# Patient Record
Sex: Male | Born: 1985
Health system: Southern US, Community
[De-identification: ages and names within clinical notes are randomized; demographics above are authoritative.]

## PROBLEM LIST (undated history)

## (undated) DIAGNOSIS — T7840XA Allergy, unspecified, initial encounter: Secondary | ICD-10-CM

## (undated) DIAGNOSIS — F419 Anxiety disorder, unspecified: Secondary | ICD-10-CM

## (undated) HISTORY — DX: Anxiety disorder, unspecified: F41.9

## (undated) HISTORY — PX: MOUTH SURGERY: SHX715

## (undated) HISTORY — DX: Allergy, unspecified, initial encounter: T78.40XA

---

## 2013-06-02 DIAGNOSIS — J309 Allergic rhinitis, unspecified: Secondary | ICD-10-CM | POA: Insufficient documentation

## 2013-06-02 DIAGNOSIS — R0982 Postnasal drip: Secondary | ICD-10-CM

## 2014-09-21 ENCOUNTER — Encounter (INDEPENDENT_AMBULATORY_CARE_PROVIDER_SITE_OTHER): Payer: Self-pay

## 2014-09-21 ENCOUNTER — Other Ambulatory Visit: Payer: Self-pay | Admitting: Family Medicine

## 2014-09-21 ENCOUNTER — Encounter: Payer: Self-pay | Admitting: Family Medicine

## 2014-09-21 ENCOUNTER — Ambulatory Visit (INDEPENDENT_AMBULATORY_CARE_PROVIDER_SITE_OTHER): Payer: BLUE CROSS/BLUE SHIELD | Admitting: Family Medicine

## 2014-09-21 VITALS — BP 122/82 | HR 71 | Temp 97.6°F | Resp 16 | Ht 71.0 in | Wt 179.5 lb

## 2014-09-21 DIAGNOSIS — Z131 Encounter for screening for diabetes mellitus: Secondary | ICD-10-CM | POA: Diagnosis not present

## 2014-09-21 DIAGNOSIS — Z13220 Encounter for screening for lipoid disorders: Secondary | ICD-10-CM | POA: Diagnosis not present

## 2014-09-21 DIAGNOSIS — Z23 Encounter for immunization: Secondary | ICD-10-CM

## 2014-09-21 DIAGNOSIS — H6120 Impacted cerumen, unspecified ear: Secondary | ICD-10-CM | POA: Insufficient documentation

## 2014-09-21 DIAGNOSIS — J45909 Unspecified asthma, uncomplicated: Secondary | ICD-10-CM

## 2014-09-21 DIAGNOSIS — Z21 Asymptomatic human immunodeficiency virus [HIV] infection status: Secondary | ICD-10-CM | POA: Insufficient documentation

## 2014-09-21 DIAGNOSIS — Z0000 Encounter for general adult medical examination without abnormal findings: Secondary | ICD-10-CM | POA: Diagnosis not present

## 2014-09-21 DIAGNOSIS — R0982 Postnasal drip: Secondary | ICD-10-CM

## 2014-09-21 DIAGNOSIS — J309 Allergic rhinitis, unspecified: Secondary | ICD-10-CM

## 2014-09-21 DIAGNOSIS — Z1389 Encounter for screening for other disorder: Secondary | ICD-10-CM | POA: Insufficient documentation

## 2014-09-21 DIAGNOSIS — F988 Other specified behavioral and emotional disorders with onset usually occurring in childhood and adolescence: Secondary | ICD-10-CM | POA: Insufficient documentation

## 2014-09-21 DIAGNOSIS — F4322 Adjustment disorder with anxiety: Secondary | ICD-10-CM | POA: Insufficient documentation

## 2014-09-21 DIAGNOSIS — J069 Acute upper respiratory infection, unspecified: Secondary | ICD-10-CM | POA: Insufficient documentation

## 2014-09-21 MED ORDER — OLOPATADINE HCL 0.6 % NA SOLN: NASAL | Status: DC

## 2014-09-21 NOTE — Progress Notes (Signed)
Name: Eric Patel   MRN: [id number]    DOB: 1985-07-13   Date:09/21/2014       Progress Note  Subjective  Chief Complaint  Chief Complaint  Patient presents with  . Annual Exam    patient states eveything is going well.    HPI  Patient is here today for a Complete Male Physical Exam:  The patient has no acute concerns. Overall feels healthy. Diet is well balanced. In general does exercise regularly. Sees dentist regularly and addresses vision concerns with ophthalmologist if applicable. In regards to sexual activity the patient is currently sexually active. Currently is not concerned about exposure to any STDs. Has previously tested positive for HIV Antibody but subsequent work up and consultation with ID specialist resulted in no significant findings. 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 Still taking Lexapro daily although he did try taking a sabbatical from the medication and noticed a difference and went right back on the medication. Otherwise using ADD medication sparingly.   Allergic Rhinitis: Eric Patel is here for evaluation of possible allergic rhinitis. Patient's symptoms include postnasal drip, sinus pressure and pressure sensation base of tongue back of throat.. These symptoms are unpredictable. Current triggers include exposure to no known precipitant. The patient has been suffering from these symptoms for approximately several  months. The patient has tried over the counter medications, prescription antihistamines and prescription nasal sprays with unsatisfactory relief of symptoms. Immunotherapy has never been tried. The patient has never had nasal polyps. The patient has no history of asthma. The patient does not suffer from frequent sinopulmonary infections. The patient has not had sinus surgery in the past. The patient has no history of eczema.    No problem-specific assessment & plan notes found  for this encounter.   Past Medical History  Diagnosis Date  . Allergy   . Anxiety   . HIV infection     active infection r/o by Dr. Sampson Goon. results were false positive.    Past Surgical History  Procedure Laterality Date  . Mouth surgery      at age 29    Family History  Problem Relation Age of Onset  . Hypertension Mother   . Hyperlipidemia Mother   . Diabetes Mother     prediabetic  . Keratoconus Mother   . Thyroid disease Mother     History   Social History  . Marital Status: Single    Spouse Name: N/A  . Number of Children: 0  . Years of Education: N/A   Occupational History  . Assistant Professor     [employer]   Social History Main Topics  . Smoking status: Never Smoker   . Smokeless tobacco: Not on file  . Alcohol Use: 0.0 oz/week    0 Standard drinks or equivalent per week     Comment: socially  . Drug Use: Yes  . Sexual Activity:    Partners: Female    Pharmacist, hospital Protection: Implant   Other Topics Concern  . Not on file   Social History Narrative  . No narrative on file     Current outpatient prescriptions:  .  amphetamine-dextroamphetamine (ADDERALL) 5 MG tablet, Take by mouth., Disp: , Rfl:  .  clonazePAM (KLONOPIN) 0.5 MG tablet, Take by mouth., Disp: , Rfl:  .  escitalopram (LEXAPRO) 10 MG tablet, Take by mouth., Disp: , Rfl:  .  Olopatadine HCl 0.6 % SOLN, 2 sprays in each  nostril twice a day, Disp: 1 Bottle, Rfl: 1  Allergies  Allergen Reactions  . Ibuprofen     face swelling.    ROS  CONSTITUTIONAL: No significant weight changes, fever, chills, weakness or fatigue.  HEENT:  - Eyes: No visual changes.  - Ears: No auditory changes. No pain.  - Nose: No sneezing, congestion, runny nose. - Throat: No sore throat. No changes in swallowing. No choking sensation. SKIN: No rash or itching.  CARDIOVASCULAR: No chest pain, chest pressure or chest discomfort. No palpitations or edema.  RESPIRATORY: No shortness of breath, cough or  sputum.  GASTROINTESTINAL: No anorexia, nausea, vomiting. No changes in bowel habits. No abdominal pain or blood.  GENITOURINARY: No dysuria. No frequency. No discharge.  NEUROLOGICAL: No headache, dizziness, syncope, paralysis, ataxia, numbness or tingling in the extremities. No memory changes. No change in bowel or bladder control.  MUSCULOSKELETAL: No joint pain. No muscle pain. HEMATOLOGIC: No anemia, bleeding or bruising.  LYMPHATICS: No enlarged lymph nodes.  PSYCHIATRIC: No change in mood. No change in sleep pattern.  ENDOCRINOLOGIC: No reports of sweating, cold or heat intolerance. No polyuria or polydipsia.   Objective  Filed Vitals:   09/21/14 0901  BP: 122/82  Pulse: 71  Temp: 97.6 F (36.4 C)  TempSrc: Oral  Resp: 16  Height: 5\' 11"  (1.803 m)  Weight: 179 lb 8 oz (81.421 kg)  SpO2: 98%   Body mass index is 25.05 kg/(m^2).   Depression screen PHQ 2/9 09/21/2014  Decreased Interest 0  Down, Depressed, Hopeless 0  PHQ - 2 Score 0     Physical Exam  Constitutional: Patient appears well-developed and well-nourished. In no distress.  HEENT:  - Head: Normocephalic and atraumatic.  - Ears: Bilateral TMs gray, no erythema or effusion - Nose: Nasal mucosa moist, slightly boggy - Mouth/Throat: Oropharynx is clear and moist. No tonsillar hypertrophy or erythema. Some post nasal drainage.  - Eyes: Conjunctivae clear, EOM movements normal. PERRLA. No scleral icterus.  Neck: Normal range of motion. Neck supple. No JVD present. No thyromegaly present.  Cardiovascular: Normal rate, regular rhythm and normal heart sounds.  No murmur heard.  Pulmonary/Chest: Effort normal and breath sounds normal. No respiratory distress. Abdominal: Soft. Bowel sounds are normal, no distension. There is no tenderness. no masses BREAST: Bilateral breast exam normal with no masses, skin changes or nipple discharge MALE GENITALIA: Bilateral testes descended with no masses, no penile lesions, no  penile discharge. PROSTATE: deferred RECTAL: deferred Musculoskeletal: Normal range of motion bilateral UE and LE, no joint effusions. Peripheral vascular: Bilateral LE no edema. Neurological: CN II-XII grossly intact with no focal deficits. Alert and oriented to person, place, and time. Coordination, balance, strength, speech and gait are normal.  Skin: Skin is warm and dry. No rash noted. No erythema. Scattered blemish spots and cherry hemangiomas back and upper arms.  Psychiatric: Patient has a normal mood and affect. Behavior is normal in office today. Judgment and thought content normal in office today.   Assessment & Plan  1. Annual physical exam Healthy male. He requested full panel of blood work.   - CBC with Differential/Platelet - Comprehensive metabolic panel - Lipid panel - TSH - Vit D  25 hydroxy (rtn osteoporosis monitoring) - HgB A1c - T3, free - T4, free  2. Allergic rhinitis with postnasal drip Stop Flonase as this is ineffective. Has also tried claritin, zyrtec, allegra with inconsistent regulst. Will try Olopatadine.   - Olopatadine HCl 0.6 % SOLN; 2 sprays  in each nostril twice a day  Dispense: 1 Bottle; Refill: 1  3. Screening cholesterol level  - Lipid panel  4. Screening for diabetes mellitus  - HgB A1c  5. Need for hepatitis B vaccination 2nd shot of Hep B series given today  - Hepatitis B vaccine adult IM  6. Need for diphtheria-tetanus-pertussis (Tdap) vaccine  - Tdap vaccine greater than or equal to 7yo IM

## 2014-09-22 LAB — COMPREHENSIVE METABOLIC PANEL
ALBUMIN: 4.7 g/dL (ref 3.5–5.5)
ALT: 80 IU/L — ABNORMAL HIGH (ref 0–44)
AST: 56 IU/L — ABNORMAL HIGH (ref 0–40)
Albumin/Globulin Ratio: 2.1 (ref 1.1–2.5)
Alkaline Phosphatase: 51 IU/L (ref 39–117)
BILIRUBIN TOTAL: 0.7 mg/dL (ref 0.0–1.2)
BUN/Creatinine Ratio: 15 (ref 8–19)
BUN: 15 mg/dL (ref 6–20)
CO2: 23 mmol/L (ref 18–29)
Calcium: 9.2 mg/dL (ref 8.7–10.2)
Chloride: 101 mmol/L (ref 97–108)
Creatinine, Ser: 0.97 mg/dL (ref 0.76–1.27)
GFR calc Af Amer: 121 mL/min/{1.73_m2} (ref >59)
GFR, EST NON AFRICAN AMERICAN: 105 mL/min/{1.73_m2} (ref >59)
Globulin, Total: 2.2 g/dL (ref 1.5–4.5)
Glucose: 95 mg/dL (ref 65–99)
Potassium: 4.8 mmol/L (ref 3.5–5.2)
Sodium: 141 mmol/L (ref 134–144)
TOTAL PROTEIN: 6.9 g/dL (ref 6.0–8.5)

## 2014-09-22 LAB — CBC WITH DIFFERENTIAL/PLATELET
BASOS: 1 %
Basophils Absolute: 0.1 10*3/uL (ref 0.0–0.2)
EOS (ABSOLUTE): 0.4 10*3/uL (ref 0.0–0.4)
EOS: 7 %
HEMOGLOBIN: 17.3 g/dL (ref 12.6–17.7)
Hematocrit: 50.1 % (ref 37.5–51.0)
Immature Grans (Abs): 0.1 10*3/uL (ref 0.0–0.1)
Immature Granulocytes: 1 %
Lymphocytes Absolute: 1.3 10*3/uL (ref 0.7–3.1)
Lymphs: 25 %
MCH: 31.1 pg (ref 26.6–33.0)
MCHC: 34.5 g/dL (ref 31.5–35.7)
MCV: 90 fL (ref 79–97)
MONOS ABS: 0.5 10*3/uL (ref 0.1–0.9)
Monocytes: 11 %
NEUTROS ABS: 2.8 10*3/uL (ref 1.4–7.0)
Neutrophils: 55 %
Platelets: 219 10*3/uL (ref 150–379)
RBC: 5.57 x10E6/uL (ref 4.14–5.80)
RDW: 13.1 % (ref 12.3–15.4)
WBC: 5.1 10*3/uL (ref 3.4–10.8)

## 2014-09-22 LAB — HEMOGLOBIN A1C
ESTIMATED AVERAGE GLUCOSE: 103 mg/dL
Hgb A1c MFr Bld: 5.2 % (ref 4.8–5.6)

## 2014-09-22 LAB — T3, FREE: T3 FREE: 3.4 pg/mL (ref 2.0–4.4)

## 2014-09-22 LAB — LIPID PANEL
Chol/HDL Ratio: 3.7 ratio units (ref 0.0–5.0)
Cholesterol, Total: 185 mg/dL (ref 100–199)
HDL: 50 mg/dL (ref >39)
LDL Calculated: 104 mg/dL — ABNORMAL HIGH (ref 0–99)
Triglycerides: 155 mg/dL — ABNORMAL HIGH (ref 0–149)
VLDL Cholesterol Cal: 31 mg/dL (ref 5–40)

## 2014-09-22 LAB — VITAMIN D 25 HYDROXY (VIT D DEFICIENCY, FRACTURES): Vit D, 25-Hydroxy: 59 ng/mL (ref 30.0–100.0)

## 2014-09-22 LAB — TSH: TSH: 2.52 u[IU]/mL (ref 0.450–4.500)

## 2014-09-22 LAB — T4, FREE: FREE T4: 1.05 ng/dL (ref 0.82–1.77)

## 2014-12-28 ENCOUNTER — Ambulatory Visit
Admission: RE | Admit: 2014-12-28 | Discharge: 2014-12-28 | Disposition: A | Discharge status: Home / Self Care | Payer: BLUE CROSS/BLUE SHIELD | Source: Ambulatory Visit | Attending: Family Medicine | Admitting: Family Medicine

## 2014-12-28 ENCOUNTER — Encounter: Payer: Self-pay | Admitting: Family Medicine

## 2014-12-28 ENCOUNTER — Ambulatory Visit
Admission: RE | Admit: 2014-12-28 | Payer: BLUE CROSS/BLUE SHIELD | Source: Ambulatory Visit | Admitting: Family Medicine

## 2014-12-28 ENCOUNTER — Ambulatory Visit (INDEPENDENT_AMBULATORY_CARE_PROVIDER_SITE_OTHER): Payer: BLUE CROSS/BLUE SHIELD | Admitting: Family Medicine

## 2014-12-28 VITALS — BP 118/64 | HR 91 | Temp 97.6°F | Resp 16 | Ht 70.5 in | Wt 181.7 lb

## 2014-12-28 DIAGNOSIS — M2550 Pain in unspecified joint: Secondary | ICD-10-CM | POA: Diagnosis not present

## 2014-12-28 DIAGNOSIS — F4322 Adjustment disorder with anxiety: Secondary | ICD-10-CM | POA: Diagnosis not present

## 2014-12-28 DIAGNOSIS — S46912A Strain of unspecified muscle, fascia and tendon at shoulder and upper arm level, left arm, initial encounter: Secondary | ICD-10-CM | POA: Insufficient documentation

## 2014-12-28 DIAGNOSIS — F988 Other specified behavioral and emotional disorders with onset usually occurring in childhood and adolescence: Secondary | ICD-10-CM

## 2014-12-28 DIAGNOSIS — M791 Myalgia, unspecified site: Secondary | ICD-10-CM | POA: Insufficient documentation

## 2014-12-28 DIAGNOSIS — X58XXXA Exposure to other specified factors, initial encounter: Secondary | ICD-10-CM | POA: Insufficient documentation

## 2014-12-28 DIAGNOSIS — F900 Attention-deficit hyperactivity disorder, predominantly inattentive type: Secondary | ICD-10-CM

## 2014-12-28 MED ORDER — TRAMADOL HCL 50 MG PO TABS: 50.0000 mg | ORAL_TABLET | Freq: Three times a day (TID) | ORAL | Status: DC | PRN

## 2014-12-28 MED ORDER — CLONAZEPAM 0.5 MG PO TABS: 0.5000 mg | ORAL_TABLET | Freq: Two times a day (BID) | ORAL | Status: DC | PRN

## 2014-12-28 MED ORDER — AMPHETAMINE-DEXTROAMPHETAMINE 5 MG PO TABS: 5.0000 mg | ORAL_TABLET | Freq: Two times a day (BID) | ORAL | Status: DC

## 2014-12-28 NOTE — Progress Notes (Signed)
Name: Eric Patel   MRN: [id number]    DOB: 1985-08-23   Date:12/28/2014       Progress Note  Subjective  Chief Complaint  Chief Complaint  Patient presents with  . Shoulder Pain    patient was working out at [employer] and afterwards had a stabbing pain. limited rom. has tried naproxen but has had no relief. patient has not tried any ice or heat.  . Extremity Weakness    patient has noticed some weakness in his legs. some muscle twitching around the knee area.  . Medication Refill    all meds    HPI  [name] is a 29 year old here today for refills of his routine medications and to discuss some new symptoms. Has been exercising with weights as usual and for the past 2 days notes left anterior shoulder pain. Felt like a stabbing pain initially and it is not very responsive to naproxen. He avoids Ibuprofen as he is allergic to it. He also notes restricted abduction. Not associated with numbness, tingling, weakness. Otherwise he also complains of generalized lower extremity joint and muscle pain, occasional muscle twitches like he had just worked up but can occur any time during work day. Does not wake him up at night. Joints involved usually bilateral knees, sometimes anterior hip areas. Does not smoke, no known history of circulatory disorder. Muscle pain not worse with ambulation.   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 He has stopped taking Lexapro. But is using clonazepam 0.5 mg sparingly, requesting refill today.  Otherwise using ADD medication sparingly. No side effects reported. The condition is characterized as fidgeting, loses items necessary for activity, interrupting others, poor attention span, shifting from one uncompleted task to another and easily distracted but not excessive talking, difficulty remaining seated, difficulty waiting for his turn, engages in physically dangerous activities, difficulty  waiting for his turn, blurting out answers before question is complete, difficulty following instructions or antisocial behavior. There has been no associated hearing difficulties, vision disturbances other than issues with diabetes, difficulty speaking or sleeping. He currently uses Adderall 5 mg po bid.    Past Medical History  Diagnosis Date  . Allergy   . Anxiety     Patient Active Problem List   Diagnosis Date Noted  . Adjustment disorder with anxiety 09/21/2014  . Adult attention deficit disorder 09/21/2014  . Screening for gout 09/21/2014  . Need for prophylactic vaccination and inoculation against viral hepatitis 09/21/2014  . Cerumen impaction 09/21/2014  . Infection of the upper respiratory tract 09/21/2014  . Allergic rhinitis with postnasal drip 06/02/2013    Social History  Substance Use Topics  . Smoking status: Never Smoker   . Smokeless tobacco: Not on file  . Alcohol Use: 0.0 oz/week    0 Standard drinks or equivalent per week     Comment: socially     Current outpatient prescriptions:  .  amphetamine-dextroamphetamine (ADDERALL) 5 MG tablet, Take by mouth., Disp: , Rfl:  .  clonazePAM (KLONOPIN) 0.5 MG tablet, Take by mouth., Disp: , Rfl:  .  escitalopram (LEXAPRO) 10 MG tablet, Take by mouth., Disp: , Rfl:  .  Olopatadine HCl 0.6 % SOLN, 2 sprays in each nostril twice a day, Disp: 1 Bottle, Rfl: 1  Past Surgical History  Procedure Laterality Date  . Mouth surgery      at age 41    Family History  Problem Relation  Age of Onset  . Hypertension Mother   . Hyperlipidemia Mother   . Diabetes Mother     prediabetic  . Keratoconus Mother   . Thyroid disease Mother     Allergies  Allergen Reactions  . Ibuprofen     face swelling.     Review of Systems  CONSTITUTIONAL: No significant weight changes, fever, chills, weakness or fatigue.  HEENT:  - Eyes: No visual changes.  - Ears: No auditory changes. No pain.  - Nose: No sneezing, congestion,  runny nose. - Throat: No sore throat. No changes in swallowing. SKIN: No rash or itching.  CARDIOVASCULAR: No chest pain, chest pressure or chest discomfort. No palpitations or edema.  RESPIRATORY: No shortness of breath, cough or sputum.  NEUROLOGICAL: No headache, dizziness, syncope, paralysis, ataxia, numbness or tingling in the extremities. No memory changes. No change in bowel or bladder control.  MUSCULOSKELETAL: Yes joint pain. Yes muscle pain. HEMATOLOGIC: No anemia, bleeding or bruising.  LYMPHATICS: No enlarged lymph nodes.  PSYCHIATRIC: No change in mood. No change in sleep pattern.  ENDOCRINOLOGIC: No reports of sweating, cold or heat intolerance. No polyuria or polydipsia.     Objective  BP 118/64 mmHg  Pulse 91  Temp(Src) 97.6 F (36.4 C) (Oral)  Resp 16  Ht 5' 10.5" (1.791 m)  Wt 181 lb 11.2 oz (82.419 kg)  BMI 25.69 kg/m2  SpO2 98% Body mass index is 25.69 kg/(m^2).  Physical Exam  Constitutional: Patient appears well-developed and well-nourished. In no distress.  Neck: Normal range of motion. Neck supple. No JVD present. No thyromegaly present.  Cardiovascular: Normal rate, regular rhythm and normal heart sounds.  No murmur heard.  Pulmonary/Chest: Effort normal and breath sounds normal. No respiratory distress. Musculoskeletal: Normal range of motion bilateral UE and LE, no joint effusions, although there is pain with abduction and flexion of left arm at shoulder beyond 100 degrees. Negative Hawkins, lift off and empty can testing. Peripheral vascular: Bilateral LE no edema. Neurological: CN II-XII grossly intact with no focal deficits. Alert and oriented to person, place, and time. Coordination, balance, strength, speech and gait are normal.  Skin: Skin is warm and dry. No rash noted. No erythema.  Psychiatric: Patient has a normal mood and affect. Behavior is normal in office today. Judgment and thought content normal in office today.   Assessment &  Plan  1. Adjustment disorder with anxiety Self discontinued Lexapro. Doing well.  - clonazePAM (KLONOPIN) 0.5 MG tablet; Take 1 tablet (0.5 mg total) by mouth 2 (two) times daily as needed for anxiety.  Dispense: 40 tablet; Refill: 5  2. Adult attention deficit disorder Refilled Adderall. Doing well with no side effects.   - amphetamine-dextroamphetamine (ADDERALL) 5 MG tablet; Take 1 tablet (5 mg total) by mouth 2 (two) times daily with a meal.  Dispense: 60 tablet; Refill: 0  3. Myalgia Etiology unclear in a healthy male with no significant physical findings. Will start with blood work.  - CBC with Differential/Platelet - Comprehensive metabolic panel - Magnesium - Ferritin - Iron - Iron and TIBC - ANA - Sedimentation rate - Rheumatoid factor - Aldolase  4. Multiple joint pain Rule out rheumatological disorder.  - traMADol (ULTRAM) 50 MG tablet; Take 1 tablet (50 mg total) by mouth every 8 (eight) hours as needed for moderate pain or severe pain.  Dispense: 40 tablet; Refill: 0 - CBC with Differential/Platelet - Comprehensive metabolic panel - Magnesium - Ferritin - Iron - Iron and TIBC - ANA -  Sedimentation rate - Rheumatoid factor  5. Left shoulder strain, initial encounter Soft tissue injury suggested based on clinical story and exam. Will get X-ray of left shoulder on file. Instructed on using heat, ice, home exercises and tramadol if needed. If no improvement I recommend PT.   - traMADol (ULTRAM) 50 MG tablet; Take 1 tablet (50 mg total) by mouth every 8 (eight) hours as needed for moderate pain or severe pain.  Dispense: 40 tablet; Refill: 0 - DG Shoulder Left; Future

## 2014-12-29 LAB — CBC WITH DIFFERENTIAL/PLATELET
Basophils Absolute: 0.1 10*3/uL (ref 0.0–0.2)
Basos: 1 %
EOS (ABSOLUTE): 0.3 10*3/uL (ref 0.0–0.4)
Eos: 7 %
HEMOGLOBIN: 17.2 g/dL (ref 12.6–17.7)
Hematocrit: 50.1 % (ref 37.5–51.0)
Immature Grans (Abs): 0 10*3/uL (ref 0.0–0.1)
Immature Granulocytes: 1 %
LYMPHS ABS: 1.2 10*3/uL (ref 0.7–3.1)
Lymphs: 28 %
MCH: 31.1 pg (ref 26.6–33.0)
MCHC: 34.3 g/dL (ref 31.5–35.7)
MCV: 91 fL (ref 79–97)
Monocytes Absolute: 0.4 10*3/uL (ref 0.1–0.9)
Monocytes: 9 %
NEUTROS ABS: 2.3 10*3/uL (ref 1.4–7.0)
Neutrophils: 54 %
PLATELETS: 194 10*3/uL (ref 150–379)
RBC: 5.53 x10E6/uL (ref 4.14–5.80)
RDW: 13.1 % (ref 12.3–15.4)
WBC: 4.3 10*3/uL (ref 3.4–10.8)

## 2014-12-29 LAB — RHEUMATOID FACTOR

## 2014-12-29 LAB — IRON AND TIBC
Iron Saturation: 57 % — ABNORMAL HIGH (ref 15–55)
Iron: 153 ug/dL (ref 38–169)
Total Iron Binding Capacity: 268 ug/dL (ref 250–450)
UIBC: 115 ug/dL (ref 111–343)

## 2014-12-29 LAB — COMPREHENSIVE METABOLIC PANEL
ALBUMIN: 4.9 g/dL (ref 3.5–5.5)
ALT: 27 IU/L (ref 0–44)
AST: 20 IU/L (ref 0–40)
Albumin/Globulin Ratio: 2.7 — ABNORMAL HIGH (ref 1.1–2.5)
Alkaline Phosphatase: 50 IU/L (ref 39–117)
BUN/Creatinine Ratio: 17 (ref 8–19)
BUN: 15 mg/dL (ref 6–20)
Bilirubin Total: 0.8 mg/dL (ref 0.0–1.2)
CHLORIDE: 101 mmol/L (ref 97–108)
CO2: 27 mmol/L (ref 18–29)
Calcium: 9.5 mg/dL (ref 8.7–10.2)
Creatinine, Ser: 0.87 mg/dL (ref 0.76–1.27)
GFR calc Af Amer: 134 mL/min/{1.73_m2} (ref >59)
GFR calc non Af Amer: 116 mL/min/{1.73_m2} (ref >59)
GLUCOSE: 91 mg/dL (ref 65–99)
Globulin, Total: 1.8 g/dL (ref 1.5–4.5)
Potassium: 4.8 mmol/L (ref 3.5–5.2)
Sodium: 143 mmol/L (ref 134–144)
Total Protein: 6.7 g/dL (ref 6.0–8.5)

## 2014-12-29 LAB — ANA: Anti Nuclear Antibody(ANA): NEGATIVE

## 2014-12-29 LAB — ALDOLASE: Aldolase: 4.7 U/L (ref 3.3–10.3)

## 2014-12-29 LAB — SEDIMENTATION RATE: SED RATE: 2 mm/h (ref 0–15)

## 2014-12-29 LAB — MAGNESIUM: MAGNESIUM: 2.2 mg/dL (ref 1.6–2.3)

## 2014-12-29 LAB — FERRITIN: Ferritin: 259 ng/mL (ref 30–400)

## 2015-10-03 ENCOUNTER — Encounter: Payer: BLUE CROSS/BLUE SHIELD | Admitting: Family Medicine

## 2015-10-05 ENCOUNTER — Encounter: Payer: Self-pay | Admitting: Family Medicine

## 2015-10-05 ENCOUNTER — Ambulatory Visit (INDEPENDENT_AMBULATORY_CARE_PROVIDER_SITE_OTHER): Payer: BLUE CROSS/BLUE SHIELD | Admitting: Family Medicine

## 2015-10-05 VITALS — BP 118/68 | HR 87 | Temp 99.3°F | Resp 18 | Ht 71.0 in | Wt 174.4 lb

## 2015-10-05 DIAGNOSIS — F900 Attention-deficit hyperactivity disorder, predominantly inattentive type: Secondary | ICD-10-CM

## 2015-10-05 DIAGNOSIS — F988 Other specified behavioral and emotional disorders with onset usually occurring in childhood and adolescence: Secondary | ICD-10-CM

## 2015-10-05 DIAGNOSIS — F410 Panic disorder [episodic paroxysmal anxiety] without agoraphobia: Secondary | ICD-10-CM | POA: Diagnosis not present

## 2015-10-05 DIAGNOSIS — Z23 Encounter for immunization: Secondary | ICD-10-CM

## 2015-10-05 DIAGNOSIS — Z79899 Other long term (current) drug therapy: Secondary | ICD-10-CM | POA: Diagnosis not present

## 2015-10-05 DIAGNOSIS — F430 Acute stress reaction: Secondary | ICD-10-CM

## 2015-10-05 DIAGNOSIS — H6123 Impacted cerumen, bilateral: Secondary | ICD-10-CM | POA: Diagnosis not present

## 2015-10-05 DIAGNOSIS — F4322 Adjustment disorder with anxiety: Secondary | ICD-10-CM

## 2015-10-05 DIAGNOSIS — Z0000 Encounter for general adult medical examination without abnormal findings: Secondary | ICD-10-CM | POA: Diagnosis not present

## 2015-10-05 LAB — CBC WITH DIFFERENTIAL/PLATELET
BASOS ABS: 42 {cells}/uL (ref 0–200)
BASOS PCT: 1 %
Eosinophils Absolute: 168 cells/uL (ref 15–500)
Eosinophils Relative: 4 %
HEMATOCRIT: 49 % (ref 38.5–50.0)
Hemoglobin: 17 g/dL (ref 13.2–17.1)
LYMPHS PCT: 30 %
Lymphs Abs: 1260 cells/uL (ref 850–3900)
MCH: 31.1 pg (ref 27.0–33.0)
MCHC: 34.7 g/dL (ref 32.0–36.0)
MCV: 89.6 fL (ref 80.0–100.0)
MONOS PCT: 10 %
MPV: 9.4 fL (ref 7.5–12.5)
Monocytes Absolute: 420 cells/uL (ref 200–950)
NEUTROS PCT: 55 %
Neutro Abs: 2310 cells/uL (ref 1500–7800)
PLATELETS: 186 10*3/uL (ref 140–400)
RBC: 5.47 MIL/uL (ref 4.20–5.80)
RDW: 12.9 % (ref 11.0–15.0)
WBC: 4.2 10*3/uL (ref 3.8–10.8)

## 2015-10-05 MED ORDER — AMPHETAMINE-DEXTROAMPHETAMINE 5 MG PO TABS: 5.0000 mg | ORAL_TABLET | Freq: Two times a day (BID) | ORAL | Status: AC

## 2015-10-05 MED ORDER — CLONAZEPAM 0.5 MG PO TABS: 0.5000 mg | ORAL_TABLET | Freq: Two times a day (BID) | ORAL | Status: AC | PRN

## 2015-10-05 NOTE — Assessment & Plan Note (Signed)
Explained benzo is controlled substance; went through typical speech; UDS collected; limited Rx for prn use; will see him every 3 months for this type of medicine; never ever use with alcohol

## 2015-10-05 NOTE — Assessment & Plan Note (Signed)
USPSTF grade A and B recommendations reviewed with patient; age-appropriate recommendations, preventive care, screening tests, etc discussed and encouraged; healthy living encouraged; see AVS for patient education given to patient  

## 2015-10-05 NOTE — Patient Instructions (Addendum)
OHIO -- only handle it once [facility] Distraction by [facility]  Health Maintenance, Male A healthy lifestyle and preventative care can promote health and wellness.  Maintain regular health, dental, and eye exams.  Eat a healthy diet. Foods like vegetables, fruits, whole grains, low-fat dairy products, and lean protein foods contain the nutrients you need and are low in calories. Decrease your intake of foods high in solid fats, added sugars, and salt. Get information about a proper diet from your health care provider, if necessary.  Regular physical exercise is one of the most important things you can do for your health. Most adults should get at least 150 minutes of moderate-intensity exercise (any activity that increases your heart rate and causes you to sweat) each week. In addition, most adults need muscle-strengthening exercises on 2 or more days a week.   Maintain a healthy weight. The body mass index (BMI) is a screening tool to identify possible weight problems. It provides an estimate of body fat based on height and weight. Your health care provider can find your BMI and can help you achieve or maintain a healthy weight. For males 20 years and older:  A BMI below 18.5 is considered underweight.  A BMI of 18.5 to 24.9 is normal.  A BMI of 25 to 29.9 is considered overweight.  A BMI of 30 and above is considered obese.  Maintain normal blood lipids and cholesterol by exercising and minimizing your intake of saturated fat. Eat a balanced diet with plenty of fruits and vegetables. Blood tests for lipids and cholesterol should begin at age 31 and be repeated every 5 years. If your lipid or cholesterol levels are high, you are over age 31, or you are at high risk for heart disease, you may need your cholesterol levels checked more frequently.Ongoing high lipid and cholesterol levels should be treated with medicines if diet and exercise are not working.  If you smoke, find out  from your health care provider how to quit. If you do not use tobacco, do not start.  Lung cancer screening is recommended for adults aged 55-80 years who are at high risk for developing lung cancer because of a history of smoking. A yearly low-dose CT scan of the lungs is recommended for people who have at least a 30-pack-year history of smoking and are current smokers or have quit within the past 15 years. A pack year of smoking is smoking an average of 1 pack of cigarettes a day for 1 year (for example, a 30-pack-year history of smoking could mean smoking 1 pack a day for 30 years or 2 packs a day for 15 years). Yearly screening should continue until the smoker has stopped smoking for at least 15 years. Yearly screening should be stopped for people who develop a health problem that would prevent them from having lung cancer treatment.  If you choose to drink alcohol, do not have more than 2 drinks per day. One drink is considered to be 12 oz (360 mL) of beer, 5 oz (150 mL) of wine, or 1.5 oz (45 mL) of liquor.  Avoid the use of street drugs. Do not share needles with anyone. Ask for help if you need support or instructions about stopping the use of drugs.  High blood pressure causes heart disease and increases the risk of stroke. High blood pressure is more likely to develop in:  People who have blood pressure in the end of the normal range (100-139/85-89 mm Hg).  People who are overweight or obese.  People who are African American.  If you are 41-18 years of age, have your blood pressure checked every 3-5 years. If you are 32 years of age or older, have your blood pressure checked every year. You should have your blood pressure measured twice--once when you are at a hospital or clinic, and once when you are not at a hospital or clinic. Record the average of the two measurements. To check your blood pressure when you are not at a hospital or clinic, you can use:  An automated blood pressure  machine at a pharmacy.  A home blood pressure monitor.  If you are 60-71 years old, ask your health care provider if you should take aspirin to prevent heart disease.  Diabetes screening involves taking a blood sample to check your fasting blood sugar level. This should be done once every 3 years after age 67 if you are at a normal weight and without risk factors for diabetes. Testing should be considered at a younger age or be carried out more frequently if you are overweight and have at least 1 risk factor for diabetes.  Colorectal cancer can be detected and often prevented. Most routine colorectal cancer screening begins at the age of 6 and continues through age 34. However, your health care provider may recommend screening at an earlier age if you have risk factors for colon cancer. On a yearly basis, your health care provider may provide home test kits to check for hidden blood in the stool. A small camera at the end of a tube may be used to directly examine the colon (sigmoidoscopy or colonoscopy) to detect the earliest forms of colorectal cancer. Talk to your health care provider about this at age 51 when routine screening begins. A direct exam of the colon should be repeated every 5-10 years through age 42, unless early forms of precancerous polyps or small growths are found.  People who are at an increased risk for hepatitis B should be screened for this virus. You are considered at high risk for hepatitis B if:  You were born in a country where hepatitis B occurs often. Talk with your health care provider about which countries are considered high risk.  Your parents were born in a high-risk country and you have not received a shot to protect against hepatitis B (hepatitis B vaccine).  You have HIV or AIDS.  You use needles to inject street drugs.  You live with, or have sex with, someone who has hepatitis B.  You are a man who has sex with other men (MSM).  You get hemodialysis  treatment.  You take certain medicines for conditions like cancer, organ transplantation, and autoimmune conditions.  Hepatitis C blood testing is recommended for all people born from [birth year] through 1966 and any individual with known risk factors for hepatitis C.  Healthy men should no longer receive prostate-specific antigen (PSA) blood tests as part of routine cancer screening. Talk to your health care provider about prostate cancer screening.  Testicular cancer screening is not recommended for adolescents or adult males who have no symptoms. Screening includes self-exam, a health care provider exam, and other screening tests. Consult with your health care provider about any symptoms you have or any concerns you have about testicular cancer.  Practice safe sex. Use condoms and avoid high-risk sexual practices to reduce the spread of sexually transmitted infections (STIs).  You should be screened for STIs, including gonorrhea and chlamydia if:  You are sexually active and are younger than 24 years.  You are older than 24 years, and your health care provider tells you that you are at risk for this type of infection.  Your sexual activity has changed since you were last screened, and you are at an increased risk for chlamydia or gonorrhea. Ask your health care provider if you are at risk.  If you are at risk of being infected with HIV, it is recommended that you take a prescription medicine daily to prevent HIV infection. This is called pre-exposure prophylaxis (PrEP). You are considered at risk if:  You are a man who has sex with other men (MSM).  You are a heterosexual man who is sexually active with multiple partners.  You take drugs by injection.  You are sexually active with a partner who has HIV.  Talk with your health care provider about whether you are at high risk of being infected with HIV. If you choose to begin PrEP, you should first be tested for HIV. You should then be tested  every 3 months for as long as you are taking PrEP.  Use sunscreen. Apply sunscreen liberally and repeatedly throughout the day. You should seek shade when your shadow is shorter than you. Protect yourself by wearing long sleeves, pants, a wide-brimmed hat, and sunglasses year round whenever you are outdoors.  Tell your health care provider of new moles or changes in moles, especially if there is a change in shape or color. Also, tell your health care provider if a mole is larger than the size of a pencil eraser.  A one-time screening for abdominal aortic aneurysm (AAA) and surgical repair of large AAAs by ultrasound is recommended for men aged 44-75 years who are current or former smokers.  Stay current with your vaccines (immunizations).   This information is not intended to replace advice given to you by your health care provider. Make sure you discuss any questions you have with your health care provider.   Document Released: 09/07/2007 Document Revised: 04/01/2014 Document Reviewed: 08/06/2010 Elsevier Interactive Patient Education [employer].

## 2015-10-05 NOTE — Progress Notes (Signed)
Patient ID: Eric Patel, male   DOB: 09/24/1985, 30 y.o.   MRN: [id number]   Subjective:   Eric SaltChristopher Patel is a 30 y.o. male here for a complete physical exam  Patient is new to me today; his previous provider left the practice  Interim issues since last visit: nothing  USPSTF grade A and B recommendations Alcohol: yes, just 1-2 a week Depression:  [facility] 10/05/2015 12/28/2014 09/21/2014  Decreased Interest 0 0 0  Down, Depressed, Hopeless 0 0 0  PHQ - 2 Score 0 0 0  Hypertension: excellent Obesity: no Tobacco use: never  HIV: UTD STD testing and prevention (chl/gon/syphilis): declined Lipids: fasting Glucose: fasting Colorectal cancer: no first degree relatives on mother's side; father no colon cancer Breast cancer: no lumps Lung cancer: n/a Osteoporosis: n/a AAA: n/a Aspirin: n/a Diet: good eater Exercise: yes Skin cancer: nothing worrisome  --------------------- Problem-based issues: He had joint pain in the fall; had lots of labs drawn; all normal; he thinks it was 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 Esperanza RichtersBruce Thomson; last dose was a month ago, not taking during the summer; exercise Sometimes has anxiety attacks; uses benzo PRN; can't sleep at night, mind wanders; takes one every 2-3 weeks; feels a little tired, but not knocking him out; knows to not drink alcohol with this; last dose was 2 weeks ago  Past Medical History  Diagnosis Date  . Allergy   . Anxiety    Past Surgical History  Procedure Laterality Date  . Mouth surgery      at age 30   Family History  Problem Relation Age of Onset  . Hypertension Mother   . Hyperlipidemia Mother   . Diabetes Mother     prediabetic  . Keratoconus Mother   . Thyroid disease Mother    Social History  Substance Use Topics  . Smoking status: Never  Smoker   . Smokeless tobacco: Not on file  . Alcohol Use: 0.0 oz/week    0 Standard drinks or equivalent per week     Comment: socially   Review of Systems  Objective:   Filed Vitals:   10/05/15 1122  BP: 118/68  Pulse: 87  Temp: 99.3 F (37.4 C)  Resp: 18  Height: 5\' 11"  (1.803 m)  Weight: 174 lb 7 oz (79.124 kg)  SpO2: 97%   Body mass index is 24.34 kg/(m^2). Wt Readings from Last 3 Encounters:  10/05/15 174 lb 7 oz (79.124 kg)  12/28/14 181 lb 11.2 oz (82.419 kg)  09/21/14 179 lb 8 oz (81.421 kg)   Physical Exam  Constitutional: He appears well-developed and well-nourished. No distress.  HENT:  Head: Normocephalic and atraumatic.  Nose: Nose normal.  Mouth/Throat: Oropharynx is clear and moist and mucous membranes are normal.  Cerumen in both ear canals  Eyes: EOM are normal. No scleral icterus.  Neck: No JVD present. No thyromegaly present.  Cardiovascular: Normal rate, regular rhythm and normal heart sounds.   Pulmonary/Chest: Effort normal and breath sounds normal. No respiratory distress. He has no wheezes. He has no rales.  Abdominal: Soft. Bowel sounds are normal. He exhibits no distension. There is no tenderness. There is no guarding.  Musculoskeletal: Normal range of motion. He exhibits no edema.  Lymphadenopathy:    He has no cervical adenopathy.  Neurological: He is alert. He displays normal reflexes.  He exhibits normal muscle tone. Coordination normal.  Reflex Scores:      Patellar reflexes are 2+ on the right side and 2+ on the left side. No tics, no tremors  Skin: Skin is warm and dry. No rash noted. He is not diaphoretic. No erythema. No pallor.  Psychiatric: He has a normal mood and affect. His behavior is normal. Judgment and thought content normal. His mood appears not anxious. He is not hyperactive. He does not exhibit a depressed mood.  Good eye contact with examiner    Assessment/Plan:   Problem List Items Addressed This Visit      Nervous  and Auditory   Cerumen impaction    Noted on exam today, no worries; if able to hear, no reason to remove        Other   Preventative health care - Primary    USPSTF grade A and B recommendations reviewed with patient; age-appropriate recommendations, preventive care, screening tests, etc discussed and encouraged; healthy living encouraged; see AVS for patient education given to patient      Relevant Orders   Comprehensive metabolic panel   Lipid panel   CBC with Differential/Platelet   Panic attack as reaction to stress    Explained benzo is controlled substance; went through typical speech; UDS collected; limited Rx for prn use; will see him every 3 months for this type of medicine; never ever use with alcohol      Controlled substance agreement signed    Discussed risk of controlled substances including possible unintentional overdose, especially if mixed with alcohol or other pills; typical speech given including illegal to share, even out of the goodness of patient's heart, always keep in the original bottle, safeguard medicine, do NOT mix with alcohol, other pain pills, "nerve" or anxiety pills, or sleeping pills; I am not obligated to approve of early refill or give new prescription if medicine is lost, stolen, or destroyed even with a police report, etc.; patient agrees with plan; controlled substance contract signed; copy of contract given to patient       Relevant Orders   Drug Screen, Urine   Adult attention deficit disorder    Evaluated by Dr. Esperanza Richters; continue medicine; [facility]; using stimulant PRN; Answers to Distraction      Relevant Medications   amphetamine-dextroamphetamine (ADDERALL) 5 MG tablet   Adjustment disorder with anxiety   Relevant Medications   clonazePAM (KLONOPIN) 0.5 MG tablet    Other Visit Diagnoses    Need for hepatitis B vaccination        Hep B vaccine given today    Relevant Orders    Hepatitis B vaccine adult IM (Completed)        Meds ordered this encounter  Medications  . amphetamine-dextroamphetamine (ADDERALL) 5 MG tablet    Sig: Take 1 tablet (5 mg total) by mouth 2 (two) times daily with a meal.    Dispense:  50 tablet    Refill:  0  . clonazePAM (KLONOPIN) 0.5 MG tablet    Sig: Take 1 tablet (0.5 mg total) by mouth 2 (two) times daily as needed for anxiety.    Dispense:  15 tablet    Refill:  0   Orders Placed This Encounter  Procedures  . Hepatitis B vaccine adult IM  . Comprehensive metabolic panel    Order Specific Question:  Has the patient fasted?    Answer:  Yes  . Lipid panel    Order Specific Question:  Has  the patient fasted?    Answer:  Yes  . CBC with Differential/Platelet  . Drug Screen, Urine    Follow up plan: Return in about 1 year (around 10/04/2016) for complete physical; 3 months for medication management. An after-visit summary was printed and given to the patient at check-out.  Please see the patient instructions which may contain other information and recommendations beyond what is mentioned above in the assessment and plan.

## 2015-10-05 NOTE — Assessment & Plan Note (Signed)
Noted on exam today, no worries; if able to hear, no reason to remove

## 2015-10-05 NOTE — Assessment & Plan Note (Signed)

## 2015-10-05 NOTE — Assessment & Plan Note (Signed)
Evaluated by Dr. Esperanza RichtersBruce Thomson; [facility]; using stimulant PR[facility]; Answers to Distraction

## 2015-10-06 ENCOUNTER — Telehealth: Payer: Self-pay | Admitting: Emergency Medicine

## 2015-10-06 LAB — COMPREHENSIVE METABOLIC PANEL
ALT: 22 U/L (ref 9–46)
AST: 21 U/L (ref 10–40)
Albumin: 4.5 g/dL (ref 3.6–5.1)
Alkaline Phosphatase: 46 U/L (ref 40–115)
BILIRUBIN TOTAL: 0.8 mg/dL (ref 0.2–1.2)
BUN: 13 mg/dL (ref 7–25)
CALCIUM: 9.3 mg/dL (ref 8.6–10.3)
CO2: 26 mmol/L (ref 20–31)
Chloride: 101 mmol/L (ref 98–110)
Creat: 0.9 mg/dL (ref 0.60–1.35)
GLUCOSE: 90 mg/dL (ref 65–99)
POTASSIUM: 4.6 mmol/L (ref 3.5–5.3)
Sodium: 138 mmol/L (ref 135–146)
Total Protein: 7 g/dL (ref 6.1–8.1)

## 2015-10-06 LAB — LIPID PANEL
CHOL/HDL RATIO: 3.1 ratio (ref <=5.0)
Cholesterol: 148 mg/dL (ref 125–200)
HDL: 48 mg/dL (ref >=40)
LDL CALC: 88 mg/dL (ref <130)
Triglycerides: 58 mg/dL (ref <150)
VLDL: 12 mg/dL (ref <30)

## 2015-10-06 NOTE — Telephone Encounter (Signed)
Patient notified of labs.   

## 2015-11-02 IMAGING — CR DG SHOULDER 2+V*L*
1 series · 3 of 3 positions shown · non-contrast
Comparison: None.

CLINICAL DATA: Left shoulder pain. Weight lifting injury. Initial
evaluation.

EXAM:
LEFT SHOULDER - 2+ VIEW

[Series 1: dg shoulder left · 0.14mm/px · 3 of 3 slices shown]
[im 1/3]
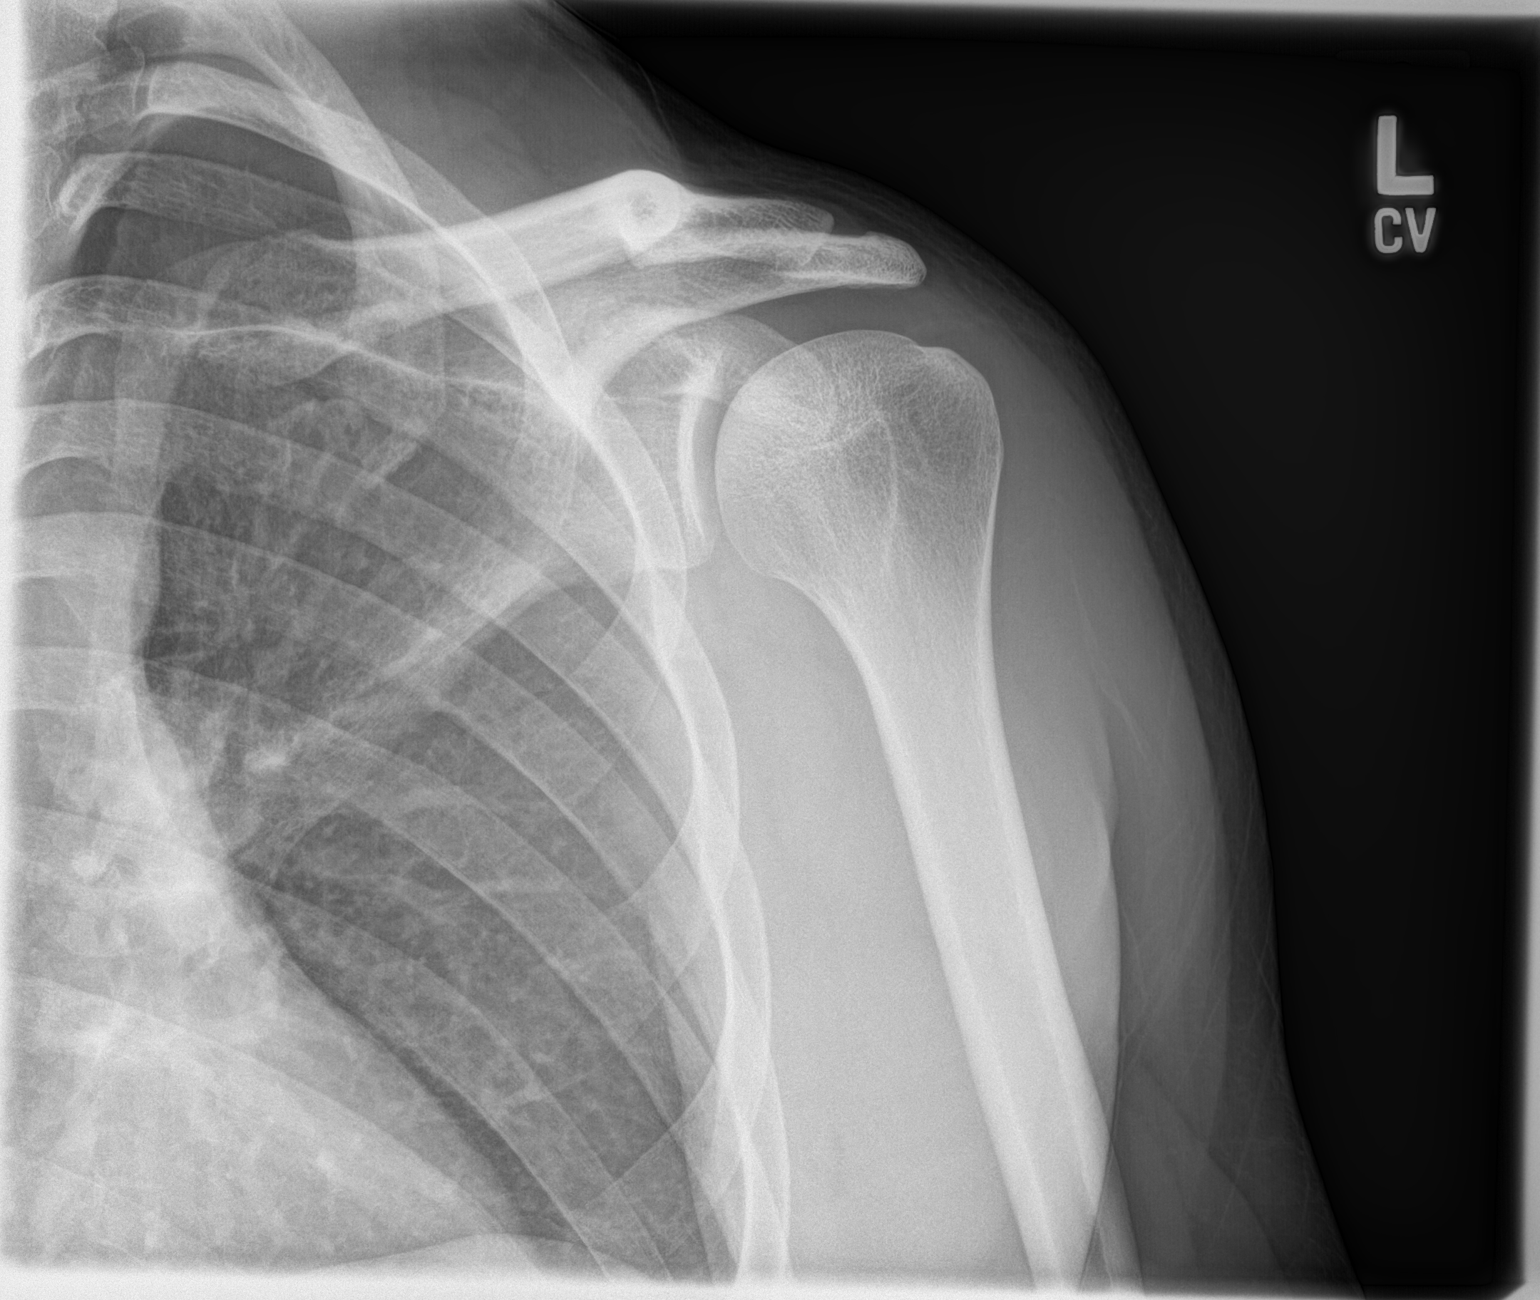
[im 2/3]
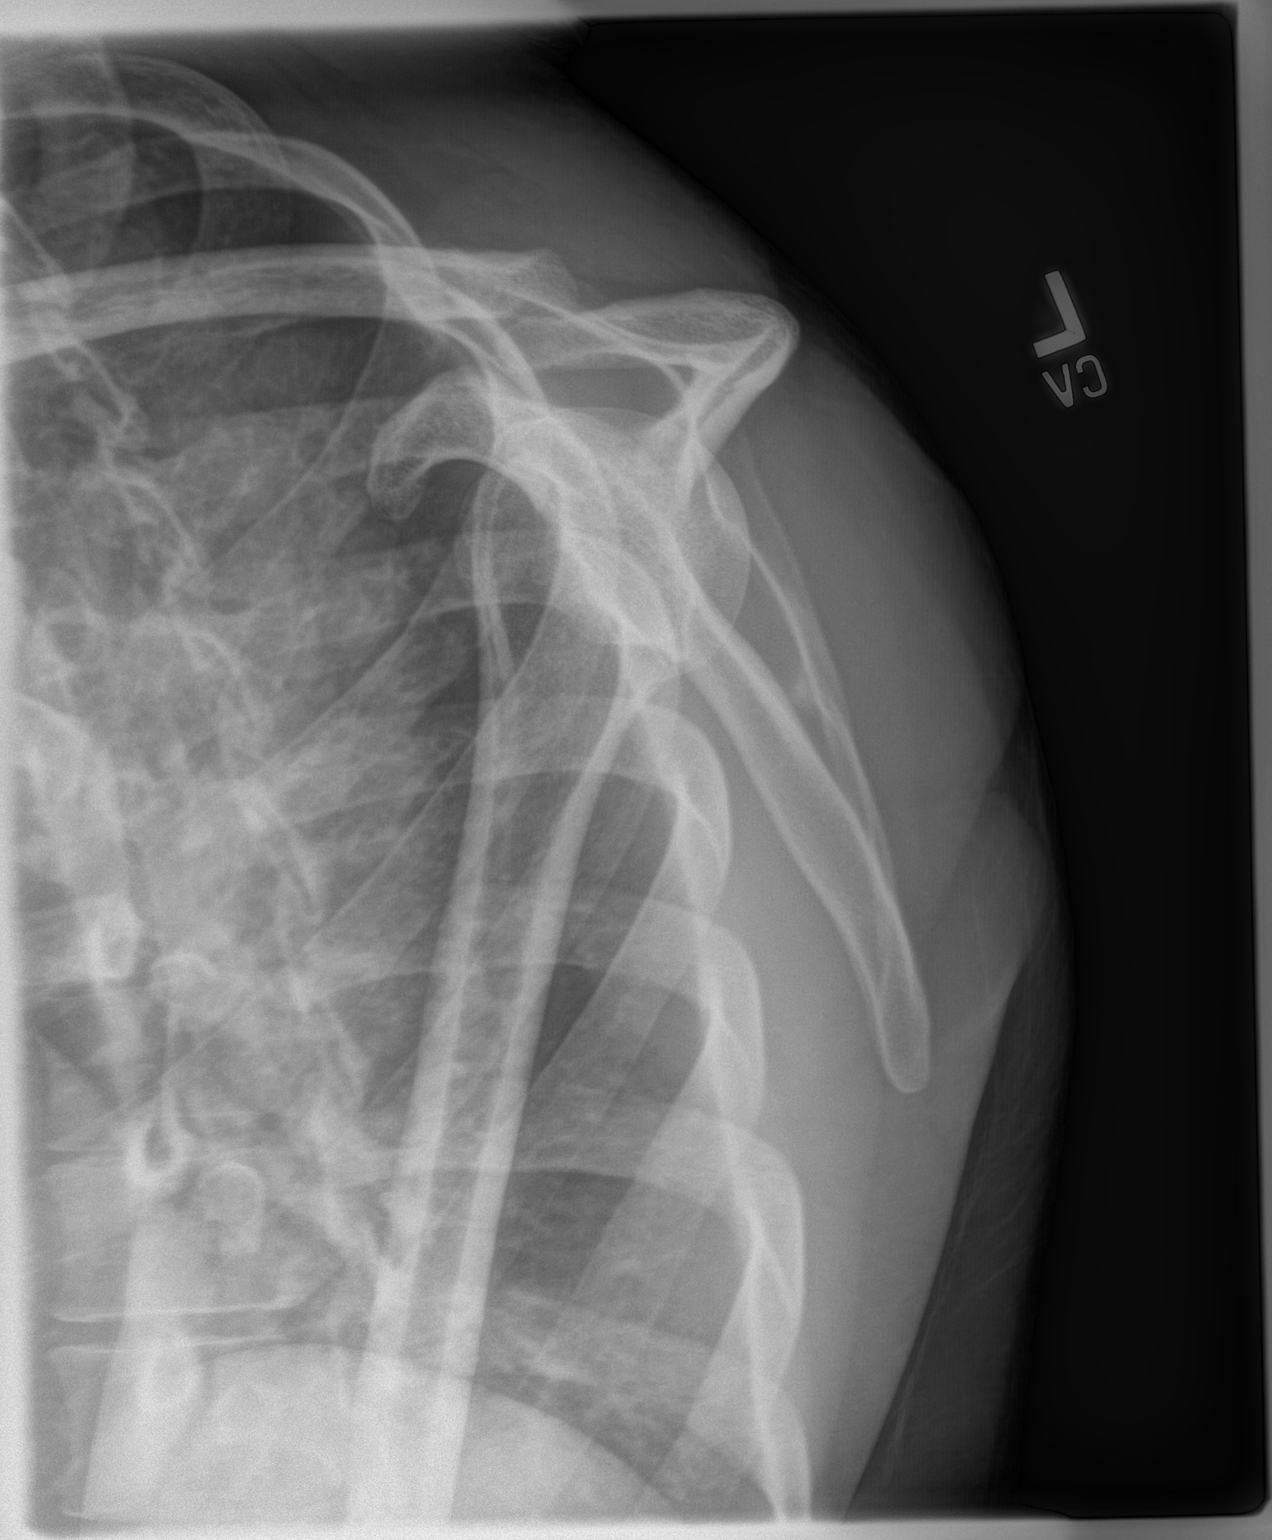
[im 3/3]
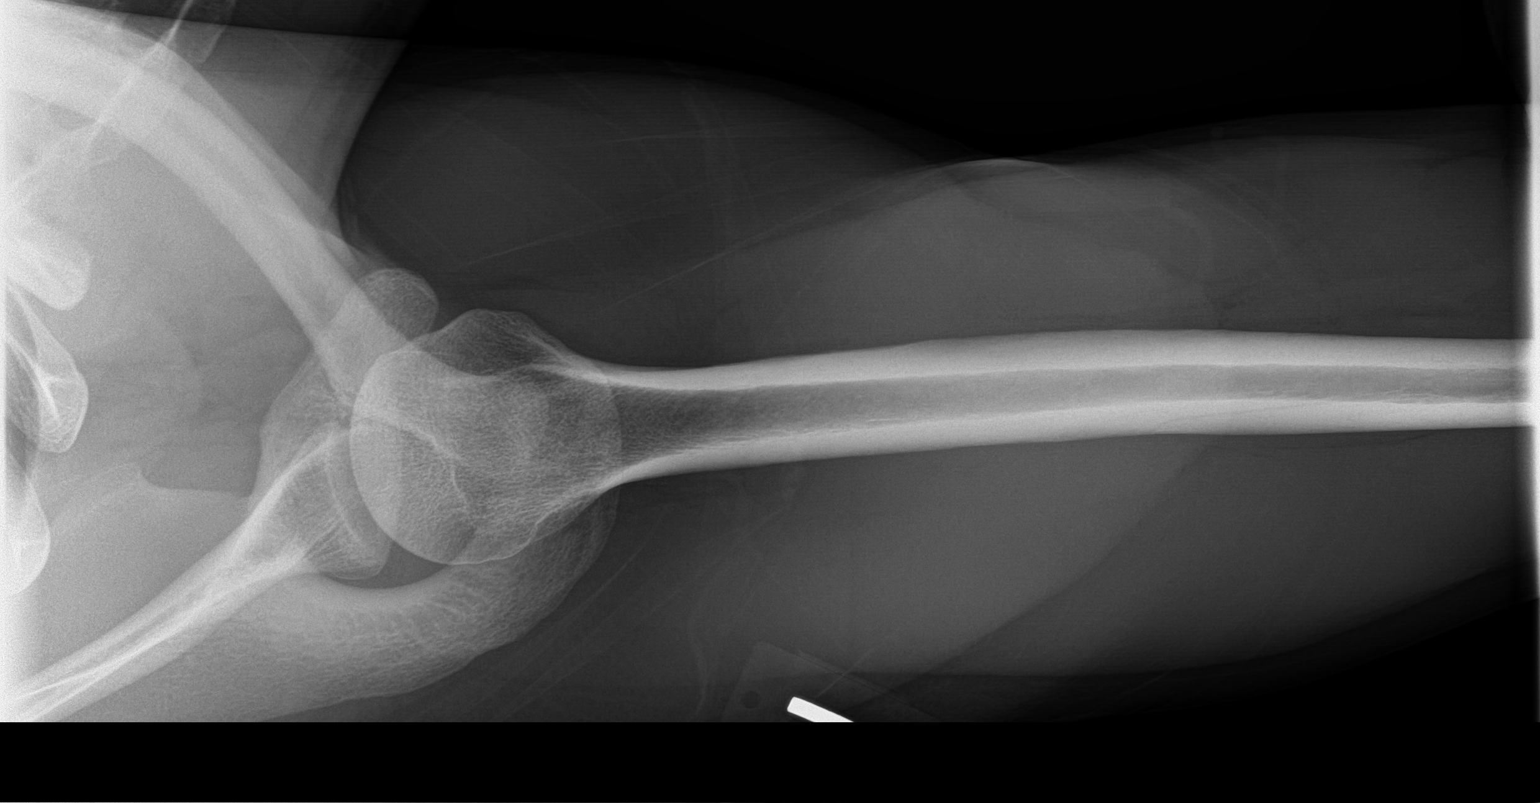

[3 of 3 positions shown; findings below may reference images not displayed]

FINDINGS: No acute bony or joint abnormality identified. No evidence of
fracture or dislocation . Tiny density noted in the scapula may
represent a tiny bone island. No other sclerotic abnormalities
identified.
IMPRESSION: No acute abnormality.

## 2016-10-09 ENCOUNTER — Encounter: Payer: Self-pay | Admitting: Family Medicine

## 2016-10-09 ENCOUNTER — Ambulatory Visit (INDEPENDENT_AMBULATORY_CARE_PROVIDER_SITE_OTHER): Payer: BLUE CROSS/BLUE SHIELD | Admitting: Family Medicine

## 2016-10-09 VITALS — BP 116/64 | HR 74 | Temp 97.7°F | Resp 14 | Ht 70.5 in | Wt 178.4 lb

## 2016-10-09 DIAGNOSIS — Z8639 Personal history of other endocrine, nutritional and metabolic disease: Secondary | ICD-10-CM | POA: Insufficient documentation

## 2016-10-09 DIAGNOSIS — Z0000 Encounter for general adult medical examination without abnormal findings: Secondary | ICD-10-CM

## 2016-10-09 LAB — CBC WITH DIFFERENTIAL/PLATELET
BASOS PCT: 1 %
Basophils Absolute: 39 cells/uL (ref 0–200)
EOS ABS: 195 {cells}/uL (ref 15–500)
Eosinophils Relative: 5 %
HEMATOCRIT: 48.2 % (ref 38.5–50.0)
Hemoglobin: 16.7 g/dL (ref 13.2–17.1)
LYMPHS PCT: 34 %
Lymphs Abs: 1326 cells/uL (ref 850–3900)
MCH: 31.7 pg (ref 27.0–33.0)
MCHC: 34.6 g/dL (ref 32.0–36.0)
MCV: 91.6 fL (ref 80.0–100.0)
MONO ABS: 273 {cells}/uL (ref 200–950)
MPV: 9.1 fL (ref 7.5–12.5)
Monocytes Relative: 7 %
Neutro Abs: 2067 cells/uL (ref 1500–7800)
Neutrophils Relative %: 53 %
Platelets: 168 10*3/uL (ref 140–400)
RBC: 5.26 MIL/uL (ref 4.20–5.80)
RDW: 12.8 % (ref 11.0–15.0)
WBC: 3.9 10*3/uL (ref 3.8–10.8)

## 2016-10-09 LAB — TSH: TSH: 2.6 mIU/L (ref 0.40–4.50)

## 2016-10-09 NOTE — Assessment & Plan Note (Signed)
USPSTF grade A and B recommendations reviewed with patient; age-appropriate recommendations, preventive care, screening tests, etc discussed and encouraged; healthy living encouraged; see AVS for patient education given to patient  

## 2016-10-09 NOTE — Patient Instructions (Addendum)
We'll get labs today If you have not heard anything from my staff in a week about any orders/referrals/studies from today, please contact us here to follow-up [phone]   Health Maintenance, Male A healthy lifestyle and preventive care is important for your health and wellness. Ask your health care provider about what schedule of regular examinations is right for you. What should I know about weight and diet? Eat a Healthy Diet  Eat plenty of vegetables, fruits, whole grains, low-fat dairy products, and lean protein.  Do not eat a lot of foods high in solid fats, added sugars, or salt.  Maintain a Healthy Weight Regular exercise can help you achieve or maintain a healthy weight. You should:  Do at least 150 minutes of exercise each week. The exercise should increase your heart rate and make you sweat (moderate-intensity exercise).  Do strength-training exercises at least twice a week.  Watch Your Levels of Cholesterol and Blood Lipids  Have your blood tested for lipids and cholesterol every 5 years starting at 32 years of age. If you are at high risk for heart disease, you should start having your blood tested when you are 32 years old. You may need to have your cholesterol levels checked more often if: ? Your lipid or cholesterol levels are high. ? You are older than 32 years of age. ? You are at high risk for heart disease.  What should I know about cancer screening? Many types of cancers can be detected early and may often be prevented. Lung Cancer  You should be screened every year for lung cancer if: ? You are a current smoker who has smoked for at least 30 years. ? You are a former smoker who has quit within the past 15 years.  Talk to your health care provider about your screening options, when you should start screening, and how often you should be screened.  Colorectal Cancer  Routine colorectal cancer screening usually begins at 32 years of age and should be  repeated every 5-10 years until you are 32 years old. You may need to be screened more often if early forms of precancerous polyps or small growths are found. Your health care provider may recommend screening at an earlier age if you have risk factors for colon cancer.  Your health care provider may recommend using home test kits to check for hidden blood in the stool.  A small camera at the end of a tube can be used to examine your colon (sigmoidoscopy or colonoscopy). This checks for the earliest forms of colorectal cancer.  Prostate and Testicular Cancer  Depending on your age and overall health, your health care provider may do certain tests to screen for prostate and testicular cancer.  Talk to your health care provider about any symptoms or concerns you have about testicular or prostate cancer.  Skin Cancer  Check your skin from head to toe regularly.  Tell your health care provider about any new moles or changes in moles, especially if: ? There is a change in a mole's size, shape, or color. ? You have a mole that is larger than a pencil eraser.  Always use sunscreen. Apply sunscreen liberally and repeat throughout the day.  Protect yourself by wearing long sleeves, pants, a wide-brimmed hat, and sunglasses when outside.  What should I know about heart disease, diabetes, and high blood pressure?  If you are 18-39 years of age, have your blood pressure checked every 3-5 years. If you are 40   years of age or older, have your blood pressure checked every year. You should have your blood pressure measured twice-once when you are at a hospital or clinic, and once when you are not at a hospital or clinic. Record the average of the two measurements. To check your blood pressure when you are not at a hospital or clinic, you can use: ? An automated blood pressure machine at a pharmacy. ? A home blood pressure monitor.  Talk to your health care provider about your target blood  pressure.  If you are between 45-79 years old, ask your health care provider if you should take aspirin to prevent heart disease.  Have regular diabetes screenings by checking your fasting blood sugar level. ? If you are at a normal weight and have a low risk for diabetes, have this test once every three years after the age of 45. ? If you are overweight and have a high risk for diabetes, consider being tested at a younger age or more often.  A one-time screening for abdominal aortic aneurysm (AAA) by ultrasound is recommended for men aged 65-75 years who are current or former smokers. What should I know about preventing infection? Hepatitis B If you have a higher risk for hepatitis B, you should be screened for this virus. Talk with your health care provider to find out if you are at risk for hepatitis B infection. Hepatitis C Blood testing is recommended for:  Everyone born from 1945 through 1965.  Anyone with known risk factors for hepatitis C.  Sexually Transmitted Diseases (STDs)  You should be screened each year for STDs including gonorrhea and chlamydia if: ? You are sexually active and are younger than 32 years of age. ? You are older than 32 years of age and your health care provider tells you that you are at risk for this type of infection. ? Your sexual activity has changed since you were last screened and you are at an increased risk for chlamydia or gonorrhea. Ask your health care provider if you are at risk.  Talk with your health care provider about whether you are at high risk of being infected with HIV. Your health care provider may recommend a prescription medicine to help prevent HIV infection.  What else can I do?  Schedule regular health, dental, and eye exams.  Stay current with your vaccines (immunizations).  Do not use any tobacco products, such as cigarettes, chewing tobacco, and e-cigarettes. If you need help quitting, ask your health care  provider.  Limit alcohol intake to no more than 2 drinks per day. One drink equals 12 ounces of beer, 5 ounces of wine, or 1 ounces of hard liquor.  Do not use street drugs.  Do not share needles.  Ask your health care provider for help if you need support or information about quitting drugs.  Tell your health care provider if you often feel depressed.  Tell your health care provider if you have ever been abused or do not feel safe at home. This information is not intended to replace advice given to you by your health care provider. Make sure you discuss any questions you have with your health care provider. Document Released: 09/07/2007 Document Revised: 11/08/2015 Document Reviewed: 12/13/2014 Elsevier Interactive Patient Education  2018 [employer]  

## 2016-10-09 NOTE — Progress Notes (Signed)
  Patient ID: Eric Patel, male   DOB: 08/15/1985, 31 y.o.   MRN: [id number]   Subjective:   Eric SaltChristopher Forstner is a 31 y.o. male here for a complete physical exam  Interim issues since last visit:  Past Medical History:  Diagnosis Date  . Allergy   . Anxiety    Past Surgical History:  Procedure Laterality Date  . MOUTH SURGERY     at age 31   Family History  Problem Relation Age of Onset  . Hypertension Mother   . Hyperlipidemia Mother   . Diabetes Mother        prediabetic  . Keratoconus Mother   . Thyroid disease Mother   . Drug abuse Maternal Grandmother        pre dm  . Hyperlipidemia Maternal Grandmother   . Hypertension Maternal Grandmother   . Drug abuse Maternal Grandfather   . Heart attack Maternal Grandfather    Social History  Substance Use Topics  . Smoking status: Never Smoker  . Smokeless tobacco: Never Used  . Alcohol use 0.0 oz/week     Comment: socially   Review of Systems  Objective:   Vitals:   10/09/16 0825  BP: 116/64  Pulse: 74  Resp: 14  Temp: 97.7 F (36.5 C)  TempSrc: Oral  SpO2: 95%  Weight: 178 lb 6.4 oz (80.9 kg)  Height: 5' 10.5" (1.791 m)   Body mass index is 25.24 kg/m. Wt Readings from Last 3 Encounters:  10/09/16 178 lb 6.4 oz (80.9 kg)  10/05/15 174 lb 7 oz (79.1 kg)  12/28/14 181 lb 11.2 oz (82.4 kg)   Physical Exam  Assessment/Plan:   Problem List Items Addressed This Visit    None       No orders of the defined types were placed in this encounter.  No orders of the defined types were placed in this encounter.   Follow up plan: No Follow-up on file.  An After Visit Summary was printed and given to the patient.

## 2016-10-09 NOTE — Progress Notes (Addendum)
Patient ID: Eric Patel, male   DOB: 12/16/1985, 31 y.o.   MRN: [id number]   Subjective:   Eric SaltChristopher Patel is a 31 y.o. male here for a complete physical exam  Interim issues since last visit: none; pt reports hx of low vit D and would like that checked today  USPSTF grade A and B recommendations Depression:  [facility] 10/09/2016 10/05/2015 12/28/2014 09/21/2014  Decreased Interest 0 0 0 0  Down, Depressed, Hopeless 0 0 0 0  PHQ - 2 Score 0 0 0 0   Hypertension: BP Readings from Last 3 Encounters:  10/09/16 116/64  10/05/15 118/68  12/28/14 118/64   Obesity: Wt Readings from Last 3 Encounters:  10/09/16 178 lb 6.4 oz (80.9 kg)  10/05/15 174 lb 7 oz (79.1 kg)  12/28/14 181 lb 11.2 oz (82.4 kg)   BMI Readings from Last 3 Encounters:  10/09/16 25.24 kg/m  10/05/15 24.33 kg/m  12/28/14 25.70 kg/m    Alcohol: one drink or even two Tobacco use: never HIV, hep B, hep C: declined Single STD testing and prevention (chl/gon/syphilis): declined Lipids:  Lab Results  Component Value Date   CHOL 148 10/05/2015   CHOL 185 09/21/2014   Lab Results  Component Value Date   HDL 48 10/05/2015   HDL 50 09/21/2014   Lab Results  Component Value Date   LDLCALC 88 10/05/2015   LDLCALC 104 (H) 09/21/2014   Lab Results  Component Value Date   TRIG 58 10/05/2015   TRIG 155 (H) 09/21/2014   Lab Results  Component Value Date   CHOLHDL 3.1 10/05/2015   CHOLHDL 3.7 09/21/2014   No results found for: LDLDIRECT Glucose:  Glucose  Date Value Ref Range Status  12/28/2014 91 65 - 99 mg/dL Final  82/95/621306/29/2016 95 65 - 99 mg/dL Final   Glucose, Bld  Date Value Ref Range Status  10/05/2015 90 65 - 99 mg/dL Final   Colorectal cancer: no colon cancer Prostate cancer: start at age 32 per current guidelines No results found for: PSA Lung cancer:  nonsmoker AAA: n/a Aspirin: n/a Diet: better than Exercise: he does weights and cardio Skin cancer:  nothing worrisome  Past Medical History:  Diagnosis Date  . Allergy   . Anxiety    Past Surgical History:  Procedure Laterality Date  . MOUTH SURGERY     at age 31   Family History  Problem Relation Age of Onset  . Hypertension Mother   . Hyperlipidemia Mother   . Diabetes Mother        prediabetic  . Keratoconus Mother   . Thyroid disease Mother   . Drug abuse Maternal Grandmother        pre dm  . Hyperlipidemia Maternal Grandmother   . Hypertension Maternal Grandmother   . Drug abuse Maternal Grandfather   . Heart attack Maternal Grandfather    Social History  Substance Use Topics  . Smoking status: Never Smoker  . Smokeless tobacco: Never Used  . Alcohol use 0.0 oz/week     Comment: socially   Review of Systems  Constitutional: Negative for unexpected weight change.  Eyes:       Wears glasses  Respiratory: Negative for wheezing.   Cardiovascular: Negative for chest pain.  Gastrointestinal: Negative for blood in stool.  Endocrine: Negative for polydipsia and polyuria.  Genitourinary: Negative for hematuria.  Musculoskeletal: Negative for arthralgias and myalgias.  Hematological: Negative for adenopathy. Does not bruise/bleed easily.  Psychiatric/Behavioral: Negative for dysphoric  mood.   Objective:   Vitals:   10/09/16 0825  BP: 116/64  Pulse: 74  Resp: 14  Temp: 97.7 F (36.5 C)  TempSrc: Oral  SpO2: 95%  Weight: 178 lb 6.4 oz (80.9 kg)  Height: 5' 10.5" (1.791 m)   Body mass index is 25.24 kg/m. Wt Readings from Last 3 Encounters:  10/09/16 178 lb 6.4 oz (80.9 kg)  10/05/15 174 lb 7 oz (79.1 kg)  12/28/14 181 lb 11.2 oz (82.4 kg)   Physical Exam  Constitutional: He appears well-developed and well-nourished. No distress.  HENT:  Head: Normocephalic and atraumatic.  Nose: Nose normal.  Mouth/Throat: Oropharynx is clear and moist.  Eyes: EOM are normal. No scleral icterus.  Neck: No JVD present. No thyromegaly present.  Cardiovascular:  Normal rate, regular rhythm and normal heart sounds.   Pulmonary/Chest: Effort normal and breath sounds normal. No respiratory distress. He has no wheezes. He has no rales.  Abdominal: Soft. Bowel sounds are normal. He exhibits no distension. There is no tenderness. There is no guarding.  Musculoskeletal: Normal range of motion. He exhibits no edema.  Lymphadenopathy:    He has no cervical adenopathy.  Neurological: He is alert. He displays normal reflexes. He exhibits normal muscle tone. Coordination normal.  Skin: Skin is warm and dry. No rash noted. He is not diaphoretic. No erythema. No pallor.  Psychiatric: He has a normal mood and affect. His behavior is normal. Judgment and thought content normal.    Assessment/Plan:   Problem List Items Addressed This Visit      Other   Preventative health care    USPSTF grade A and B recommendations reviewed with patient; age-appropriate recommendations, preventive care, screening tests, etc discussed and encouraged; healthy living encouraged; see AVS for patient education given to patient       Relevant Orders   CBC with Differential/Platelet   COMPLETE METABOLIC PANEL WITH GFR   Lipid panel   TSH   History of vitamin D deficiency    Outdoors, but not this summer; working indoors; check level and supplement if needed      Relevant Orders   VITAMIN D 25 Hydroxy (Vit-D Deficiency, Fractures)      No orders of the defined types were placed in this encounter.  Orders Placed This Encounter  Procedures  . CBC with Differential/Platelet  . COMPLETE METABOLIC PANEL WITH GFR  . Lipid panel  . TSH  . VITAMIN D 25 Hydroxy (Vit-D Deficiency, Fractures)    Follow up plan: Return in about 1 year (around 10/09/2017) for complete physical; return for problem-based visit in the next week or two.  An After Visit Summary was printed and given to the patient. Addendum: fingernail beds were slightly purplish, hands were cold; normal for him,  comes and goes when cold; discussed possible Raynaud's, medicine can be used if needed

## 2016-10-09 NOTE — Assessment & Plan Note (Signed)
Outdoors, but not this summer; working indoors; check level and supplement if needed

## 2016-10-10 LAB — COMPLETE METABOLIC PANEL WITH GFR
ALBUMIN: 4.4 g/dL (ref 3.6–5.1)
ALT: 31 U/L (ref 9–46)
AST: 26 U/L (ref 10–40)
Alkaline Phosphatase: 47 U/L (ref 40–115)
BILIRUBIN TOTAL: 0.6 mg/dL (ref 0.2–1.2)
BUN: 16 mg/dL (ref 7–25)
CO2: 25 mmol/L (ref 20–31)
CREATININE: 0.9 mg/dL (ref 0.60–1.35)
Calcium: 9 mg/dL (ref 8.6–10.3)
Chloride: 105 mmol/L (ref 98–110)
GFR, Est Non African American: >89 mL/min (ref >=60)
Glucose, Bld: 85 mg/dL (ref 65–99)
Potassium: 4.3 mmol/L (ref 3.5–5.3)
Sodium: 141 mmol/L (ref 135–146)
TOTAL PROTEIN: 6.4 g/dL (ref 6.1–8.1)

## 2016-10-10 LAB — VITAMIN D 25 HYDROXY (VIT D DEFICIENCY, FRACTURES): Vit D, 25-Hydroxy: 77 ng/mL (ref 30–100)

## 2016-10-10 LAB — LIPID PANEL
Cholesterol: 135 mg/dL (ref <200)
HDL: 49 mg/dL (ref >40)
LDL Cholesterol: 73 mg/dL (ref <100)
TRIGLYCERIDES: 63 mg/dL (ref <150)
Total CHOL/HDL Ratio: 2.8 Ratio (ref <5.0)
VLDL: 13 mg/dL (ref <30)

## 2017-10-15 ENCOUNTER — Encounter: Payer: Self-pay | Admitting: Family Medicine

## 2017-10-15 ENCOUNTER — Ambulatory Visit (INDEPENDENT_AMBULATORY_CARE_PROVIDER_SITE_OTHER): Payer: BLUE CROSS/BLUE SHIELD | Admitting: Family Medicine

## 2017-10-15 VITALS — BP 118/72 | HR 89 | Temp 98.1°F | Resp 12 | Ht 71.0 in | Wt 179.1 lb

## 2017-10-15 DIAGNOSIS — Z789 Other specified health status: Secondary | ICD-10-CM | POA: Diagnosis not present

## 2017-10-15 DIAGNOSIS — Z8639 Personal history of other endocrine, nutritional and metabolic disease: Secondary | ICD-10-CM | POA: Diagnosis not present

## 2017-10-15 DIAGNOSIS — Z0000 Encounter for general adult medical examination without abnormal findings: Secondary | ICD-10-CM

## 2017-10-15 NOTE — Assessment & Plan Note (Signed)
USPSTF grade A and B recommendations reviewed with patient; age-appropriate recommendations, preventive care, screening tests, etc discussed and encouraged; healthy living encouraged; see AVS for patient education given to patient  

## 2017-10-15 NOTE — Patient Instructions (Signed)

## 2017-10-15 NOTE — Progress Notes (Signed)
Patient ID: Eric Patel, male   DOB: 03-22-1986, 32 y.o.   MRN: [id number]   Subjective:   Eric Patel is a 32 y.o. male here for a complete physical exam  Interim issues since last visit: no issues  USPSTF grade A and B recommendations Depression:  Depression screen [facility] 2/9 10/15/2017 10/15/2017 10/09/2016 10/05/2015 12/28/2014  Decreased Interest 0 0 0 0 0  Down, Depressed, Hopeless 0 0 0 0 0  PHQ - 2 Score 0 0 0 0 0  Altered sleeping 0 - - - -  Tired, decreased energy 0 - - - -  Change in appetite 0 - - - -  Feeling bad or failure about yourself  0 - - - -  Trouble concentrating 0 - - - -  Moving slowly or fidgety/restless 0 - - - -  Suicidal thoughts 0 - - - -  PHQ-9 Score 0 - - - -  Difficult doing work/chores Not difficult at all - - - -   Hypertension: BP Readings from Last 3 Encounters:  10/15/17 118/72  10/09/16 116/64  10/05/15 118/68   Obesity: Wt Readings from Last 3 Encounters:  10/15/17 179 lb 1.6 oz (81.2 kg)  10/09/16 178 lb 6.4 oz (80.9 kg)  10/05/15 174 lb 7 oz (79.1 kg)   BMI Readings from Last 3 Encounters:  10/15/17 24.98 kg/m  10/09/16 25.24 kg/m  10/05/15 24.33 kg/m    Immunizations: hep B series, tetanus UTD; not sure  Skin cancer: no worrisome moles Lung cancer:  nonsmoker Prostate cancer: no early prostate cancer in the fam No results found for: PSA Colorectal cancer: no fam hx AAA: n/a Aspirin: n/a Diet: better than typical [employer] eater Exercise: pretty active Alcohol: less Tobacco use: no HIV, hep B, hep C: not interested STD testing and prevention (chl/gon/syphilis): not interested Glucose:  Glucose  Date Value Ref Range Status  12/28/2014 91 65 - 99 mg/dL Final  09/21/2014 95 65 - 99 mg/dL Final   Glucose, Bld  Date Value Ref Range Status  10/09/2016 85 65 - 99 mg/dL Final  10/05/2015 90 65 - 99 mg/dL Final   Lipids:  Lab Results  Component Value Date   CHOL 135 10/09/2016   CHOL 148 10/05/2015    CHOL 185 09/21/2014   Lab Results  Component Value Date   HDL 49 10/09/2016   HDL 48 10/05/2015   HDL 50 09/21/2014   Lab Results  Component Value Date   LDLCALC 73 10/09/2016   LDLCALC 88 10/05/2015   LDLCALC 104 (H) 09/21/2014   Lab Results  Component Value Date   TRIG 63 10/09/2016   TRIG 58 10/05/2015   TRIG 155 (H) 09/21/2014   Lab Results  Component Value Date   CHOLHDL 2.8 10/09/2016   CHOLHDL 3.1 10/05/2015   CHOLHDL 3.7 09/21/2014   No results found for: LDLDIRECT   Past Medical History:  Diagnosis Date  . Allergy   . Anxiety    Past Surgical History:  Procedure Laterality Date  . MOUTH SURGERY     at age 74   Family History  Problem Relation Age of Onset  . Hypertension Mother   . Hyperlipidemia Mother   . Diabetes Mother        prediabetic  . Keratoconus Mother   . Thyroid disease Mother   . Drug abuse Maternal Grandmother        pre dm  . Hyperlipidemia Maternal Grandmother   . Hypertension Maternal Grandmother   .  Drug abuse Maternal Grandfather   . Heart attack Maternal Grandfather    Social History   Tobacco Use  . Smoking status: Never Smoker  . Smokeless tobacco: Never Used  Substance Use Topics  . Alcohol use: Yes    Alcohol/week: 0.6 oz    Types: 1 Cans of beer per week    Comment: socially  . Drug use: No   Review of Systems  Constitutional: Negative for unexpected weight change.  HENT: Negative for hearing loss.   Eyes: Negative for visual disturbance.  Respiratory: Negative for wheezing.   Cardiovascular: Negative for chest pain.  Gastrointestinal: Negative for blood in stool.  Endocrine: Negative for polydipsia.  Genitourinary: Negative for hematuria.  Allergic/Immunologic: Negative for food allergies.  Hematological: Negative for adenopathy. Does not bruise/bleed easily.    Objective:   Vitals:   10/15/17 0831  BP: 118/72  Pulse: 89  Resp: 12  Temp: 98.1 F (36.7 C)  TempSrc: Oral  SpO2: 97%  Weight:  179 lb 1.6 oz (81.2 kg)  Height: _0  (1.803 m)   Body mass index is 24.98 kg/m. Wt Readings from Last 3 Encounters:  10/15/17 179 lb 1.6 oz (81.2 kg)  10/09/16 178 lb 6.4 oz (80.9 kg)  10/05/15 174 lb 7 oz (79.1 kg)   Physical Exam  Constitutional: He appears well-developed and well-nourished. No distress.  HENT:  Head: Normocephalic and atraumatic.  Nose: Nose normal.  Mouth/Throat: Oropharynx is clear and moist.  Eyes: EOM are normal. No scleral icterus.  Neck: No JVD present. No thyromegaly present.  Cardiovascular: Normal rate, regular rhythm and normal heart sounds.  Pulmonary/Chest: Effort normal and breath sounds normal. No respiratory distress. He has no wheezes. He has no rales.  Abdominal: Soft. Bowel sounds are normal. He exhibits no distension. There is no tenderness. There is no guarding.  Musculoskeletal: Normal range of motion. He exhibits no edema.  Lymphadenopathy:    He has no cervical adenopathy.  Neurological: He is alert. He displays normal reflexes. He exhibits normal muscle tone. Coordination normal.  Skin: Skin is warm and dry. No rash noted. He is not diaphoretic. No erythema. No pallor.  Psychiatric: He has a normal mood and affect. His behavior is normal. Judgment and thought content normal.    Assessment/Plan:   Problem List Items Addressed This Visit      Other   History of vitamin D deficiency   Relevant Orders   VITAMIN D 25 Hydroxy (Vit-D Deficiency, Fractures)   Preventative health care - Primary    USPSTF grade A and B recommendations reviewed with patient; age-appropriate recommendations, preventive care, screening tests, etc discussed and encouraged; healthy living encouraged; see AVS for patient education given to patient       Relevant Orders   CBC with Differential/Platelet   COMPLETE METABOLIC PANEL WITH GFR   Lipid panel   TSH    Other Visit Diagnoses    Measles, mumps, rubella (MMR) vaccination status unknown        Relevant Orders   Measles/Mumps/Rubella Immunity      No orders of the defined types were placed in this encounter.  Orders Placed This Encounter  Procedures  . Measles/Mumps/Rubella Immunity  . CBC with Differential/Platelet  . COMPLETE METABOLIC PANEL WITH GFR  . Lipid panel  . TSH  . VITAMIN D 25 Hydroxy (Vit-D Deficiency, Fractures)    Follow up plan: Return in about 1 year (around 10/16/2018) for complete physical.  An After Visit Summary was printed  and given to the patient.

## 2017-10-16 ENCOUNTER — Telehealth: Payer: Self-pay | Admitting: Family Medicine

## 2017-10-16 ENCOUNTER — Encounter: Payer: Self-pay | Admitting: Family Medicine

## 2017-10-16 DIAGNOSIS — Z23 Encounter for immunization: Secondary | ICD-10-CM | POA: Insufficient documentation

## 2017-10-16 LAB — LIPID PANEL
CHOL/HDL RATIO: 3.4 (calc) (ref <5.0)
CHOLESTEROL: 158 mg/dL (ref <200)
HDL: 46 mg/dL (ref >40)
LDL Cholesterol (Calc): 93 mg/dL (calc)
Non-HDL Cholesterol (Calc): 112 mg/dL (calc) (ref <130)
Triglycerides: 93 mg/dL (ref <150)

## 2017-10-16 LAB — COMPLETE METABOLIC PANEL WITH GFR
AG Ratio: 2.4 (calc) (ref 1.0–2.5)
ALKALINE PHOSPHATASE (APISO): 51 U/L (ref 40–115)
ALT: 26 U/L (ref 9–46)
AST: 23 U/L (ref 10–40)
Albumin: 4.7 g/dL (ref 3.6–5.1)
BUN: 16 mg/dL (ref 7–25)
CHLORIDE: 103 mmol/L (ref 98–110)
CO2: 30 mmol/L (ref 20–32)
CREATININE: 0.87 mg/dL (ref 0.60–1.35)
Calcium: 9.3 mg/dL (ref 8.6–10.3)
GFR, Est African American: 132 mL/min/{1.73_m2} (ref >=60)
GFR, Est Non African American: 114 mL/min/{1.73_m2} (ref >=60)
GLUCOSE: 84 mg/dL (ref 65–99)
Globulin: 2 g/dL (calc) (ref 1.9–3.7)
Potassium: 4 mmol/L (ref 3.5–5.3)
Sodium: 141 mmol/L (ref 135–146)
Total Bilirubin: 0.7 mg/dL (ref 0.2–1.2)
Total Protein: 6.7 g/dL (ref 6.1–8.1)

## 2017-10-16 LAB — CBC WITH DIFFERENTIAL/PLATELET
BASOS ABS: 51 {cells}/uL (ref 0–200)
Basophils Relative: 1.1 %
Eosinophils Absolute: 271 cells/uL (ref 15–500)
Eosinophils Relative: 5.9 %
HCT: 49.5 % (ref 38.5–50.0)
HEMOGLOBIN: 17.2 g/dL — AB (ref 13.2–17.1)
Lymphs Abs: 1279 cells/uL (ref 850–3900)
MCH: 30.9 pg (ref 27.0–33.0)
MCHC: 34.7 g/dL (ref 32.0–36.0)
MCV: 89 fL (ref 80.0–100.0)
MONOS PCT: 9.3 %
MPV: 9.1 fL (ref 7.5–12.5)
NEUTROS ABS: 2571 {cells}/uL (ref 1500–7800)
Neutrophils Relative %: 55.9 %
PLATELETS: 195 10*3/uL (ref 140–400)
RBC: 5.56 10*6/uL (ref 4.20–5.80)
RDW: 12.4 % (ref 11.0–15.0)
TOTAL LYMPHOCYTE: 27.8 %
WBC mixed population: 428 cells/uL (ref 200–950)
WBC: 4.6 10*3/uL (ref 3.8–10.8)

## 2017-10-16 LAB — MEASLES/MUMPS/RUBELLA IMMUNITY
Mumps IgG: <9 AU/mL — ABNORMAL LOW
Rubella: <0.9 index — ABNORMAL LOW

## 2017-10-16 LAB — TSH: TSH: 2.26 mIU/L (ref 0.40–4.50)

## 2017-10-16 LAB — VITAMIN D 25 HYDROXY (VIT D DEFICIENCY, FRACTURES): VIT D 25 HYDROXY: 74 ng/mL (ref 30–100)

## 2017-10-16 NOTE — Telephone Encounter (Signed)
Eric Patel, please get the infectious disease nurse at the health department to the phone for me I have a patient who does not show immunity to ANY of the three, measles, mumps, or rubella, which is very unusual; he did receive all of the routine childhood immunizations so I am concerned about lack of antibody production I'd like to know how many doses of MMR he needs (1 or 2 -- the ACIP chart says 1, but my guess is 2 to be given 28 days apart) I also need to know when it is appropriate to retest IgG levels to confirm immunity (3 months, e.g.) Thank you

## 2017-10-17 NOTE — Telephone Encounter (Signed)
I spoke with health dept staff earlier today Explained we are not 100% sure he got his childhood immunizations, just guessing, but no shot records  In that event, staff recommends 2 immunization of the MMR, one month apart No follow-up testing is needed she says  Please let patient know and schedule him for the 2 MMR shots

## 2017-10-17 NOTE — Telephone Encounter (Signed)
Pt notified and he will be getting at [facility].

## 2017-10-20 ENCOUNTER — Other Ambulatory Visit: Payer: Self-pay

## 2017-10-20 MED ORDER — MEASLES, MUMPS & RUBELLA VAC ~~LOC~~ INJ: 0.5000 mL | INJECTION | Freq: Once | SUBCUTANEOUS | 1 refills | Status: AC

## 2017-10-20 NOTE — Telephone Encounter (Signed)
done

## 2017-10-20 NOTE — Telephone Encounter (Signed)
Please update pharmacy and remove old so I can send; thank you

## 2017-10-20 NOTE — Telephone Encounter (Signed)
Copied from [place] [id number]. Topic: Quick Communication - See Telephone Encounter >> Oct 20, 2017  1:31 PM Hewitt Shorts wrote: Pt now lives out of town in [place] and is wanting to know if an  RX for the MMR could be sent to [facility]  so that he could get this immunicationi  Best number [phone]

## 2018-10-19 ENCOUNTER — Encounter: Payer: BLUE CROSS/BLUE SHIELD | Admitting: Family Medicine
